# Patient Record
Sex: Male | Born: 1980 | Race: White | Hispanic: No | Marital: Married | Smoking: Never smoker
Health system: Southern US, Community
[De-identification: ages and names within clinical notes are randomized; demographics above are authoritative.]

## PROBLEM LIST (undated history)

## (undated) HISTORY — PX: OTHER SURGICAL HISTORY: SHX169

---

## 2021-03-25 ENCOUNTER — Emergency Department (HOSPITAL_BASED_OUTPATIENT_CLINIC_OR_DEPARTMENT_OTHER): Payer: Managed Care, Other (non HMO)

## 2021-03-25 ENCOUNTER — Encounter (HOSPITAL_BASED_OUTPATIENT_CLINIC_OR_DEPARTMENT_OTHER): Payer: Self-pay

## 2021-03-25 ENCOUNTER — Emergency Department (HOSPITAL_BASED_OUTPATIENT_CLINIC_OR_DEPARTMENT_OTHER)
Admission: EM | Admit: 2021-03-25 | Discharge: 2021-03-26 | Disposition: A | Discharge status: Other Acute Inpatient Hospital | Payer: Managed Care, Other (non HMO) | Attending: Emergency Medicine | Admitting: Emergency Medicine

## 2021-03-25 ENCOUNTER — Other Ambulatory Visit: Payer: Self-pay

## 2021-03-25 DIAGNOSIS — R2990 Unspecified symptoms and signs involving the nervous system: Secondary | ICD-10-CM

## 2021-03-25 DIAGNOSIS — Z20822 Contact with and (suspected) exposure to covid-19: Secondary | ICD-10-CM | POA: Insufficient documentation

## 2021-03-25 DIAGNOSIS — I639 Cerebral infarction, unspecified: Secondary | ICD-10-CM | POA: Diagnosis not present

## 2021-03-25 DIAGNOSIS — R0989 Other specified symptoms and signs involving the circulatory and respiratory systems: Secondary | ICD-10-CM | POA: Diagnosis present

## 2021-03-25 DIAGNOSIS — R29810 Facial weakness: Secondary | ICD-10-CM | POA: Diagnosis present

## 2021-03-25 LAB — RAPID URINE DRUG SCREEN, HOSP PERFORMED
Amphetamines: NOT DETECTED
Barbiturates: NOT DETECTED
Benzodiazepines: NOT DETECTED
Cocaine: NOT DETECTED
Opiates: NOT DETECTED
Tetrahydrocannabinol: NOT DETECTED

## 2021-03-25 LAB — DIFFERENTIAL
Abs Immature Granulocytes: 0.03 10*3/uL (ref 0.00–0.07)
Basophils Absolute: 0.1 10*3/uL (ref 0.0–0.1)
Basophils Relative: 1 %
Eosinophils Absolute: 0.1 10*3/uL (ref 0.0–0.5)
Eosinophils Relative: 2 %
Immature Granulocytes: 0 %
Lymphocytes Relative: 47 %
Lymphs Abs: 3.7 10*3/uL (ref 0.7–4.0)
Monocytes Absolute: 0.5 10*3/uL (ref 0.1–1.0)
Monocytes Relative: 6 %
Neutro Abs: 3.4 10*3/uL (ref 1.7–7.7)
Neutrophils Relative %: 44 %

## 2021-03-25 LAB — CBC
HCT: 43.5 % (ref 39.0–52.0)
Hemoglobin: 14.6 g/dL (ref 13.0–17.0)
MCH: 29.9 pg (ref 26.0–34.0)
MCHC: 33.6 g/dL (ref 30.0–36.0)
MCV: 89 fL (ref 80.0–100.0)
Platelets: 310 10*3/uL (ref 150–400)
RBC: 4.89 MIL/uL (ref 4.22–5.81)
RDW: 12.4 % (ref 11.5–15.5)
WBC: 7.8 10*3/uL (ref 4.0–10.5)
nRBC: 0 % (ref 0.0–0.2)

## 2021-03-25 LAB — COMPREHENSIVE METABOLIC PANEL
ALT: 46 U/L — ABNORMAL HIGH (ref 0–44)
AST: 34 U/L (ref 15–41)
Albumin: 4.7 g/dL (ref 3.5–5.0)
Alkaline Phosphatase: 61 U/L (ref 38–126)
Anion gap: 12 (ref 5–15)
BUN: 16 mg/dL (ref 6–20)
CO2: 24 mmol/L (ref 22–32)
Calcium: 9.5 mg/dL (ref 8.9–10.3)
Chloride: 100 mmol/L (ref 98–111)
Creatinine, Ser: 0.82 mg/dL (ref 0.61–1.24)
GFR, Estimated: >60 mL/min (ref >60)
Glucose, Bld: 126 mg/dL — ABNORMAL HIGH (ref 70–99)
Potassium: 3.4 mmol/L — ABNORMAL LOW (ref 3.5–5.1)
Sodium: 136 mmol/L (ref 135–145)
Total Bilirubin: 0.6 mg/dL (ref 0.3–1.2)
Total Protein: 7.9 g/dL (ref 6.5–8.1)

## 2021-03-25 LAB — RESP PANEL BY RT-PCR (FLU A&B, COVID) ARPGX2
Influenza A by PCR: NEGATIVE
Influenza B by PCR: NEGATIVE
SARS Coronavirus 2 by RT PCR: NEGATIVE

## 2021-03-25 LAB — URINALYSIS, ROUTINE W REFLEX MICROSCOPIC
Bilirubin Urine: NEGATIVE
Glucose, UA: NEGATIVE mg/dL
Hgb urine dipstick: NEGATIVE
Ketones, ur: NEGATIVE mg/dL
Leukocytes,Ua: NEGATIVE
Nitrite: NEGATIVE
Protein, ur: NEGATIVE mg/dL
Specific Gravity, Urine: 1.02 (ref 1.005–1.030)
pH: 5 (ref 5.0–8.0)

## 2021-03-25 LAB — APTT: aPTT: 26 seconds (ref 24–36)

## 2021-03-25 LAB — PROTIME-INR
INR: 0.9 (ref 0.8–1.2)
Prothrombin Time: 12 seconds (ref 11.4–15.2)

## 2021-03-25 LAB — CBG MONITORING, ED: Glucose-Capillary: 109 mg/dL — ABNORMAL HIGH (ref 70–99)

## 2021-03-25 MED ORDER — IOHEXOL 350 MG/ML SOLN
100.0000 mL | Freq: Once | INTRAVENOUS | Status: AC | PRN
  Administered 2021-03-25: 75 mL via INTRAVENOUS

## 2021-03-25 MED ORDER — IOHEXOL 350 MG/ML SOLN
100.0000 mL | Freq: Once | INTRAVENOUS | Status: AC | PRN
  Administered 2021-03-25: 100 mL via INTRAVENOUS

## 2021-03-25 MED ORDER — SODIUM CHLORIDE 0.9 % IV SOLN
50.0000 mL | Freq: Once | INTRAVENOUS | Status: AC
  Administered 2021-03-25: 50 mL via INTRAVENOUS

## 2021-03-25 MED ORDER — ALTEPLASE (STROKE) FULL DOSE INFUSION
0.9000 mg/kg | Freq: Once | INTRAVENOUS | Status: AC
  Administered 2021-03-25: 87.4 mg via INTRAVENOUS
  Filled 2021-03-25 (×2): qty 100

## 2021-03-25 MED ORDER — ACETAMINOPHEN 500 MG PO TABS
1000.0000 mg | ORAL_TABLET | Freq: Once | ORAL | Status: AC
  Administered 2021-03-25: 1000 mg via ORAL
  Filled 2021-03-25: qty 2

## 2021-03-25 NOTE — ED Notes (Signed)
ED Provider at bedside.  Tele neuro MD recommending TPA infusion.  Wife at bedside to discuss risk/benefits of tpa and answer questions

## 2021-03-25 NOTE — ED Notes (Signed)
Teleneuro exam in progress.

## 2021-03-25 NOTE — ED Notes (Signed)
Infusion complete, pt speaking more clearly, does report mild headache

## 2021-03-25 NOTE — ED Notes (Signed)
Pt reports recent tx for sinus infection

## 2021-03-25 NOTE — ED Notes (Signed)
Pt continues to have some difficulty with word finding, but less delay in speech.  Reports "feeling clearer".  No untoward bleeding observed.

## 2021-03-25 NOTE — ED Notes (Signed)
Patient transported to CT 

## 2021-03-25 NOTE — ED Notes (Signed)
Patient transported to CT with RN with monitor with TPA infusion in progress.

## 2021-03-25 NOTE — ED Provider Notes (Addendum)
Emergency Department Provider Note   I have reviewed the triage vital signs and the nursing notes.   HISTORY  Chief Complaint Weakness (Right side weakness)   HPI Drew Payne is a 40 y.o. male arrives to the emergency department by private vehicle after suddenly developing the left side with numbness, face droop, difficulty with speech.  Patient was last normal at 3:45 PM today and arrives by private vehicle driven by his wife.  Patient is brought back immediately and is having some difficulty finding the appropriate words to use and thus the history is limited.  Level 5 caveat applies with concern for acute stroke.  There are some mention from triage that he may have had some amnesia to the event as well.   Level 5 caveat: AMS- CVA symptoms   History reviewed. No pertinent past medical history.  Patient Active Problem List   Diagnosis Date Noted  . Symptoms of cerebrovascular accident (CVA) 03/25/2021   Allergies Penicillins and Amoxicillin  History reviewed. No pertinent family history.  Social History Social History   Tobacco Use  . Smoking status: Never Smoker  . Smokeless tobacco: Never Used  Substance Use Topics  . Alcohol use: Yes  . Drug use: Never    Review of Systems  Level 5 caveat: Expressive aphasia   ____________________________________________   PHYSICAL EXAM:  VITAL SIGNS: ED Triage Vitals  Enc Vitals Group     BP 03/25/21 1620 (!) 153/95     Pulse Rate 03/25/21 1620 97     Resp 03/25/21 1620 18     Temp 03/25/21 1620 97.8 F (36.6 C)     Temp Source 03/25/21 1620 Oral     SpO2 03/25/21 1620 100 %   Constitutional: Alert and oriented. Well appearing and in no acute distress. Eyes: Conjunctivae are normal. PERRL. EOMI. Head: Atraumatic. Nose: No congestion/rhinnorhea. Mouth/Throat: Mucous membranes are moist.  Neck: No stridor.   Cardiovascular: Normal rate, regular rhythm. Good peripheral circulation. Grossly normal heart sounds.    Respiratory: Normal respiratory effort.  No retractions. Lungs CTAB. Gastrointestinal: Soft and nontender. No distention.  Musculoskeletal: No lower extremity tenderness nor edema. No gross deformities of extremities. Neurologic: Patient is having significant difficulty with fluent speech.  He is able to say yes/no but when trying to explain his symptoms often becomes frustrated and says inappropriate words.  For example he is pointing to his right leg and saying that it began "[place]" instead of on the left. 5/5 strength and apparent normal sensation but speech deficit remains.  Skin:  Skin is warm, dry and intact. No rash noted.  ____________________________________________   LABS (all labs ordered are listed, but only abnormal results are displayed)  Labs Reviewed  COMPREHENSIVE METABOLIC PANEL - Abnormal; Notable for the following components:      Result Value   Potassium 3.4 (*)    Glucose, Bld 126 (*)    ALT 46 (*)    All other components within normal limits  CBG MONITORING, ED - Abnormal; Notable for the following components:   Glucose-Capillary 109 (*)    All other components within normal limits  RESP PANEL BY RT-PCR (FLU A&B, COVID) ARPGX2  ETHANOL  PROTIME-INR  APTT  CBC  DIFFERENTIAL  RAPID URINE DRUG SCREEN, HOSP PERFORMED  URINALYSIS, ROUTINE W REFLEX MICROSCOPIC   ____________________________________________  EKG   EKG Interpretation  Date/Time:  Sunday March 25 2021 16:39:03 EDT Ventricular Rate:  97 PR Interval:  142 QRS Duration: 118 QT Interval:  344 QTC Calculation: 437 R Axis:   60 Text Interpretation: Sinus rhythm Probable left atrial enlargement Incomplete right bundle branch block Confirmed by Alona Bene [phone]) on 03/25/2021 4:58:01 PM       ____________________________________________  RADIOLOGY  CT HEAD CODE STROKE WO CONTRAST  Result Date: 03/25/2021 CLINICAL DATA:  Code stroke. Neuro deficit, acute, stroke suspected. Additional  history provided: Patient reports episode of right-sided weakness and memory difficulty for 10 minutes. EXAM: CT HEAD WITHOUT CONTRAST TECHNIQUE: Contiguous axial images were obtained from the base of the skull through the vertex without intravenous contrast. COMPARISON:  No pertinent prior exams available for comparison. FINDINGS: Brain: Cerebral volume is normal. There is no acute intracranial hemorrhage. No demarcated cortical infarct. No extra-axial fluid collection. No evidence of intracranial mass. No midline shift. Vascular: No hyperdense vessel. Skull: Normal. Negative for fracture or focal lesion. Sinuses/Orbits: Visualized orbits show no acute finding. Small left frontal sinus mucous retention cyst. Background mild mucosal thickening within the left frontal sinus. Mild bilateral ethmoid sinus mucosal thickening. Small left sphenoid sinus mucous retention cyst. Moderate mucosal thickening and small volume frothy secretions within the right maxillary sinus. Partially imaged mucosal thickening within the left maxillary sinus. ASPECTS (Alberta Stroke Program Early CT Score) - Ganglionic level infarction (caudate, lentiform nuclei, internal capsule, insula, M1-M3 cortex): 7 - Supraganglionic infarction (M4-M6 cortex): 3 Total score (0-10 with 10 being normal): 10 These results were called by telephone at the time of interpretation on 03/25/2021 at 4:45 pm to provider Dajuana Palen , who verbally acknowledged these results. IMPRESSION: No evidence of acute intracranial abnormality. ASPECTS is 10. Paranasal sinus disease as described. Correlate for acute on chronic sinusitis. Electronically Signed   By: Jackey Loge DO   On: 03/25/2021 16:46   CT ANGIO HEAD CODE STROKE  Result Date: 03/25/2021 CLINICAL DATA:  Stroke/TIA, assess intracranial arteries. Confusion, difficulty with speech. EXAM: CT ANGIOGRAPHY HEAD AND NECK TECHNIQUE: Multidetector CT imaging of the head and neck was performed using the standard  protocol during bolus administration of intravenous contrast. Multiplanar CT image reconstructions and MIPs were obtained to evaluate the vascular anatomy. Carotid stenosis measurements (when applicable) are obtained utilizing NASCET criteria, using the distal internal carotid diameter as the denominator. CONTRAST:  OMNIPAQUE IOHEXOL 350 MG/ML SOLN COMPARISON:  Noncontrast head CT performed earlier today 03/25/2021. FINDINGS: CTA NECK FINDINGS Evaluation is slightly limited due to suboptimal contrast bolus timing. Aortic arch: Common origin of the innominate and left common carotid arteries. No hemodynamically significant innominate or proximal subclavian artery stenosis. Right carotid system: CCA and ICA patent within the neck without stenosis. No significant atherosclerotic disease. Tortuous cervical ICA. Left carotid system: CCA and ICA patent within the neck without stenosis. Mild tortuosity of the cervical ICA. Vertebral arteries: Vertebral arteries codominant and patent within the neck without stenosis Skeleton: No acute bony abnormality or aggressive osseous lesion. Cervical spondylosis greatest at C5-C6. Other neck: 10 mm nonspecific cystic appearing cutaneous/subcutaneous lesion within the posterior right lower neck (series 8, image 148). No cervical lymphadenopathy. Upper chest: No consolidation within the imaged lung apices. Review of the MIP images confirms the above findings CTA HEAD FINDINGS Evaluation is slightly limited due to suboptimal intracranial contrast bolus timing. Anterior circulation: The intracranial internal carotid arteries are patent. The M1 middle cerebral arteries are patent. No M2 proximal branch occlusion is identified. The anterior cerebral arteries are patent. No intracranial aneurysm is identified. Posterior circulation: The intracranial vertebral arteries are patent. The basilar artery is patent.  The posterior cerebral arteries are patent. Posterior communicating arteries  are hypoplastic or absent bilaterally. Venous sinuses: Within the limitations of contrast timing, no convincing thrombus. Anatomic variants: None significant Review of the MIP images confirms the above findings Significant limited examination. No large vessel occlusion is identified. These results were called by telephone at the time of interpretation on 03/25/2021 at 5:41 pm to provider Dr. Jacqulyn Bath, who verbally acknowledged these results. IMPRESSION: CTA neck: 1. Evaluation is slightly limited due to suboptimal contrast bolus timing. 2. The bilateral common carotid, internal carotid and vertebral arteries are patent within the neck without stenosis. 3. Nonspecific 10 mm cystic appearing cutaneous/subcutaneous lesion within the posterior right lower neck. Direct visualization is recommended. CTA head: 1. Evaluation is significantly limited due to suboptimal intracranial contrast bolus timing. 2. Within this limitation, no intracranial large vessel occlusion is identified. Electronically Signed   By: Jackey Loge DO   On: 03/25/2021 17:43   CT ANGIO NECK CODE STROKE  Result Date: 03/25/2021 CLINICAL DATA:  Stroke/TIA, assess intracranial arteries. Confusion, difficulty with speech. EXAM: CT ANGIOGRAPHY HEAD AND NECK TECHNIQUE: Multidetector CT imaging of the head and neck was performed using the standard protocol during bolus administration of intravenous contrast. Multiplanar CT image reconstructions and MIPs were obtained to evaluate the vascular anatomy. Carotid stenosis measurements (when applicable) are obtained utilizing NASCET criteria, using the distal internal carotid diameter as the denominator. CONTRAST:  OMNIPAQUE IOHEXOL 350 MG/ML SOLN COMPARISON:  Noncontrast head CT performed earlier today 03/25/2021. FINDINGS: CTA NECK FINDINGS Evaluation is slightly limited due to suboptimal contrast bolus timing. Aortic arch: Common origin of the innominate and left common carotid arteries. No hemodynamically  significant innominate or proximal subclavian artery stenosis. Right carotid system: CCA and ICA patent within the neck without stenosis. No significant atherosclerotic disease. Tortuous cervical ICA. Left carotid system: CCA and ICA patent within the neck without stenosis. Mild tortuosity of the cervical ICA. Vertebral arteries: Vertebral arteries codominant and patent within the neck without stenosis Skeleton: No acute bony abnormality or aggressive osseous lesion. Cervical spondylosis greatest at C5-C6. Other neck: 10 mm nonspecific cystic appearing cutaneous/subcutaneous lesion within the posterior right lower neck (series 8, image 148). No cervical lymphadenopathy. Upper chest: No consolidation within the imaged lung apices. Review of the MIP images confirms the above findings CTA HEAD FINDINGS Evaluation is slightly limited due to suboptimal intracranial contrast bolus timing. Anterior circulation: The intracranial internal carotid arteries are patent. The M1 middle cerebral arteries are patent. No M2 proximal branch occlusion is identified. The anterior cerebral arteries are patent. No intracranial aneurysm is identified. Posterior circulation: The intracranial vertebral arteries are patent. The basilar artery is patent. The posterior cerebral arteries are patent. Posterior communicating arteries are hypoplastic or absent bilaterally. Venous sinuses: Within the limitations of contrast timing, no convincing thrombus. Anatomic variants: None significant Review of the MIP images confirms the above findings Significant limited examination. No large vessel occlusion is identified. These results were called by telephone at the time of interpretation on 03/25/2021 at 5:41 pm to provider Dr. Jacqulyn Bath, who verbally acknowledged these results. IMPRESSION: CTA neck: 1. Evaluation is slightly limited due to suboptimal contrast bolus timing. 2. The bilateral common carotid, internal carotid and vertebral arteries are patent  within the neck without stenosis. 3. Nonspecific 10 mm cystic appearing cutaneous/subcutaneous lesion within the posterior right lower neck. Direct visualization is recommended. CTA head: 1. Evaluation is significantly limited due to suboptimal intracranial contrast bolus timing. 2. Within this limitation, no  intracranial large vessel occlusion is identified. Electronically Signed   By: Jackey Loge DO   On: 03/25/2021 17:43    ____________________________________________   PROCEDURES  Procedure(s) performed:   .Critical Care Performed by: Maia Plan, MD Authorized by: Maia Plan, MD   Critical care provider statement:    Critical care time (minutes):  45   Critical care time was exclusive of:  Separately billable procedures and treating other patients and teaching time   Critical care was necessary to treat or prevent imminent or life-threatening deterioration of the following conditions:  CNS failure or compromise   Critical care was time spent personally by me on the following activities:  Discussions with consultants, evaluation of patient's response to treatment, examination of patient, ordering and performing treatments and interventions, ordering and review of laboratory studies, ordering and review of radiographic studies, pulse oximetry, re-evaluation of patient's condition, obtaining history from patient or surrogate, review of old charts, blood draw for specimens and development of treatment plan with patient or surrogate   I assumed direction of critical care for this patient from another provider in my specialty: no     Care discussed with: admitting provider       ____________________________________________   INITIAL IMPRESSION / ASSESSMENT AND PLAN / ED COURSE  Pertinent labs & imaging results that were available during my care of the patient were reviewed by me and considered in my medical decision making (see chart for details).   Patient arrives by private  vehicle with acute onset neuro deficits.  I do not appreciate any unilateral weakness or numbness although he is having significant speech difficulty.  He is using inappropriate words at times but also having difficulty describing his symptoms fluently.  With ongoing speech deficit and report of unilateral symptoms in the car I have activated a code stroke.   04:45 PM  Spoke with Radiology. No acute findings on head CT.   Teleneurology recommending TPA and have ordered this.  Patient doing well after the infusion symptoms are improved overall.  The image quality and dye timing of the CT angio of the head is reduced after my discussion with neurology.  I spoke with Dr. Amada Jupiter with the neuro hospitalist at [facility].  He will accept him to the neuro ICU at [facility] but would like a CTA of the head to be repeated ASAP to evaluate for possible M2 occlusion.  Have reordered this and discussed with the CT team regarding need for repeat study.  Discussed patient's case with Neurology to request admission. Patient and family (if present) updated with plan. Care transferred to Neurology service.  I reviewed all nursing notes, vitals, pertinent old records, EKGs, labs, imaging (as available).  06:57 PM  Called to bedside by the nurse.  The patient's wife and husband would be most comfortable having their care at [facility]. The patient has a bed assigned at the [facility] ICU.  We discussed that I do not want to delay transfer but I am happy to call the [facility] and/or [facility] transfer center to try and get him sent to a hospital closer to them.   Spoke with Dr. Mosetta Putt and [facility]. He cannot accept this patient to [facility] due to lack of capacity and no reason why we cannot manage locally.   Dr. Regino Schultze at [facility] is able to accept. EMTALA completed. Updated patient and wife at bedside. Will arrange transfer but again this is more delayed than if patient would be admitted to [facility]  where a bed and transport is ready. [facility] understand and  [facility]   ____________________________________________  FINAL CLINICAL IMPRESSION(S) / ED DIAGNOSES  Final diagnoses:  Stroke-like symptoms    MEDICATIONS GIVEN DURING THIS VISIT:  Medications  alteplase (ACTIVASE) 1 mg/mL infusion 87.4 mg (0 mg/kg  97.1 kg Intravenous Stopped 03/25/21 1806)    Followed by  0.9 %  sodium chloride infusion (50 mLs Intravenous New Bag/Given 03/25/21 1827)  iohexol (OMNIPAQUE) 350 MG/ML injection 100 mL (100 mLs Intravenous Contrast Given 03/25/21 1713)  iohexol (OMNIPAQUE) 350 MG/ML injection 100 mL (75 mLs Intravenous Contrast Given 03/25/21 1846)    Note:  This document was prepared using Dragon voice recognition software and may include unintentional dictation errors.  Alona BeneJoshua Hanish Laraia, MD, [facility] Emergency [facility]    Devanee Pomplun, Arlyss RepressJoshua G, MD 03/25/21 Florentina [id number]    Bunnie Lederman G, MD 03/26/21 0001

## 2021-03-25 NOTE — ED Notes (Signed)
Report given to [facility] transfer center.  To call back with an ETA.

## 2021-03-25 NOTE — ED Notes (Signed)
To CT on monitor with RN

## 2021-03-25 NOTE — ED Notes (Signed)
Last known well 1545

## 2021-03-25 NOTE — ED Notes (Signed)
ED Provider at bedside. Discussing request for transfer to [facility] for admission.  Symptoms improving, wife at bedside.  Reports mild headache, Dr Jacqulyn Bath made aware.

## 2021-03-25 NOTE — Consult Note (Signed)
[facility] Tele[facility]rology Consult Services   Date of Service:   03/25/2021 16:31:50  Diagnosis:     .  I63.9 - Cerebrovascular accident (CVA), unspecified mechanism (HCC)  Impression:     . 40 yo male with no significant past medical history who presented to the ED with c/o persistent word finding with transient right sided weakness. Current exam shows no axial weakness or ataxia however persistent word finding with mild dysfluency noted. NIH 2. Due to TLKW and disabling speech symptoms patient is a thrombolytic candidate. Risk and benefits of Alteplase reviewed with patient and wife. No exclusions to treatment found, patient agreeable to plan of care. IV Alteplase given at 1705. CTA shows no LVO.              Diff DX;       Small vessel ischemic stroke       TIA              Recs:       Admit to ICU       Post tPA vital signs and neuro checks       Keep BP < 180/105       Keep Glucose < 180       Angioedema, bleeding, seizure, aspiration, fall precautions       Dysphagia screen now, if aspiration risk, NPO until cleared by speech       Hold antithrombotics until repeat CT negative       Hold any anticoagulation until cleared by Neurology       Repeat CT brain - 24 hours post IV tPA       MRI Brain       TTE with bubble study       UDS       HgAIC       Lipid panel - target LDL < 70       TSH       PT/OT/ST         Metrics: Last Known Well: 03/25/2021 15:45:37 [facility] Notification Time: 03/25/2021 16:31:11 Arrival Time: 03/25/2021 16:04:05 Stamp Time: 03/25/2021 16:31:50 Initial Response Time: 03/25/2021 16:33:37 Symptoms: persistent word finding. Marland Kitchen NIHSS Start Assessment Time: 03/25/2021 16:35:44 Patient is a candidate for Thrombolytic. Thrombolytic Medical Decision: 03/25/2021 16:37:39 Needle Time: 03/25/2021 17:05:31 Weight Noted by Staff: 97.1 kg  CT head showed no acute hemorrhage or acute core infarct.  Radiologist was called back  for review of advanced imaging on 03/25/2021 17:55:51 ED Physician notified of diagnostic impression and management plan on 03/25/2021 17:56:51  Advanced Imaging: CTA Head and Neck Completed.  LVO:No  Patient doesn't meet criteria for emergent NIR consideration   Thrombolytic Contraindications:  Last Known Well > 4.5 hours: No CT Head showing hemorrhage: No Ischemic stroke within 3 months: No Severe head trauma within 3 months: No Intracranial/intraspinal surgery within 3 months: No History of intracranial hemorrhage: No Symptoms and signs consistent with an SAH: No GI malignancy or GI bleed within 21 days: No Coagulopathy: Platelets <100 000 /mm3, INR >1.7, aPTT>40 s, or PT >15 s: No Treatment dose of LMWH within the previous 24 hrs: No Use of NOACs in past 48 hours: No Glycoprotein IIb/IIIa receptor inhibitors use: No Symptoms consistent with infective endocarditis: No Suspected aortic arch dissection: No Intra-axial intracranial neoplasm: No  Thrombolytic Decision and Management Plan: Management with thrombolytic treatment was explained to the Patient and Family as was risks and benefits and alternatives to the treatment. Patient agrees with the decision  to proceed with thrombolytic treatment. . All questions were answered and the Patient and Family expressed understanding of the treatment plan.  Our recommendations are outlined below.  Recommendations: IV Alteplase recommended.  Thrombolytic bolus given Without Complication.   IV Alteplase/Activase Total Dose - 87.4 mg IV Alteplase/Activase Bolus Dose - 8.7 mg IV Alteplase/Activase Infusion Dose - 78.7 mg   Routine post Thrombolytic monitoring including neuro checks and blood pressure control during/after treatment Monitor blood pressure Check blood pressure and neuro assessment every 15 min for 2 h, then every 30 min for 6 h, and finally every hour for 16 h.  Manage Blood Pressure per post Thrombolytic  protocol.      .  Admission to ICU     .  CT brain 24 hours post Thrombolytic     .  NPO until swallowing screen performed and passed     .  No antiplatelet agents or anticoagulants (including heparin for DVT prophylaxis) in first 24 hours     .  No Foley catheter, nasogastric tube, arterial catheter or central venous catheter for 24 hr, unless absolutely necessary     .  Telemetry     .  Bedside swallow evaluation     .  HOB less than 30 degrees     .  Euglycemia     .  Avoid hyperthermia, PRN acetaminophen     .  DVT prophylaxis     .  Inpatient Neurology Consultation     .  Stroke evaluation as per inpatient neurology recommendations  Discussed with ED physician    ------------------------------------------------------------------------------  History of Present Illness: Patient is a 40 year old Male.  Patient was brought by private transportation with symptoms of persistent word finding. .  40 yo male with no significant past medical history presents to the ED with c/o word finding and right sided weakness. Wife reports they were driving in the car when patient suddenly developed right sided facial droop and weakness with difficulty speaking. She reports weakness lasted approx 10 mins however word finding still present. She describes patient could not identify his right arm and when asked he would point to leg and was disoriented. On exam patient with dysfluency in conversation. Risk and benefits of alteplase reviewed with patient and wife. all inclusion and exclusion criteria reviewed with patient. Agree to plan of care.   Past Medical History:     . There is NO history of Hypertension     . There is NO history of Diabetes Mellitus     . There is NO history of Hyperlipidemia     . There is NO history of Atrial Fibrillation     . There is NO history of Coronary Artery Disease     . There is NO history of Stroke     . There is NO history of Covid-19     . asthma,  vasectomy  Social History: Smoking: No Alcohol Use: Yes Drug Use: No  Family History:Unable to obtain due to Patient Status  Review of System:  14 Points Review of Systems was performed and was negative except mentioned in HPI.  Anticoagulant use:  No  Antiplatelet use: No  Allergies:  Reviewed   Examination: BP(145/94), Pulse(105), Blood Glucose(109) 1A: Level of Consciousness - Alert; keenly responsive + 0 1B: Ask Month and Age - 1 Question Right + 1 1C: Blink Eyes & Squeeze Hands - Performs Both Tasks + 0 2: Test Horizontal Extraocular Movements - Normal +  0 3: Test Visual Fields - No Visual Loss + 0 4: Test Facial Palsy (Use Grimace if Obtunded) - Normal symmetry + 0 5A: Test Left Arm Motor Drift - No Drift for 10 Seconds + 0 5B: Test Right Arm Motor Drift - No Drift for 10 Seconds + 0 6A: Test Left Leg Motor Drift - No Drift for 5 Seconds + 0 6B: Test Right Leg Motor Drift - No Drift for 5 Seconds + 0 7: Test Limb Ataxia (FNF/Heel-Shin) - No Ataxia + 0 8: Test Sensation - Normal; No sensory loss + 0 9: Test Language/Aphasia - Mild-Moderate Aphasia: Some Obvious Changes, Without Significant Limitation + 1 10: Test Dysarthria - Normal + 0 11: Test Extinction/Inattention - No abnormality + 0  NIHSS Score: 2  Pre-Morbid Modified Rankin Scale: 0 Points = No symptoms at all   Patient/Family was informed the Neurology Consult would occur via TeleHealth consult by way of interactive audio and video telecommunications and consented to receiving care in this manner.   Patient is being evaluated for possible acute neurologic impairment and high probability of imminent or life-threatening deterioration. I spent total of 45 minutes providing care to this patient, including time for face to face visit via telemedicine, review of medical records, imaging studies and discussion of findings with providers, the patient and/or family.   Dr Carson Myrtle Amritha Yorke   [facility] [phone]  Case [id number]

## 2021-03-25 NOTE — ED Triage Notes (Addendum)
Pt reports episode of right sided weakness and memory difficulty for 10 min at 1550 resolved upon arrival to ED. Pt now awake, alert, answering questions appropriately, grips strong and equal bilat ambulating with steady gait.  Dr Jacqulyn Bath made aware and at bedside. Pt with delay in word finding and some inappropriate words.  Code stroke initiated.

## 2021-03-26 LAB — ETHANOL: Alcohol, Ethyl (B): <10 mg/dL (ref <10)

## 2021-06-21 ENCOUNTER — Other Ambulatory Visit: Payer: Self-pay

## 2021-06-21 NOTE — Patient Outreach (Signed)
[facility]) Care Management  06/21/2021  Drew Payne [birth year] [id number]   First telephone outreach attempt to obtain mRS. No answer. Left message for returned call.  Vanice Sarah [facility] Management Assistant [phone]

## 2021-07-03 ENCOUNTER — Other Ambulatory Visit: Payer: Self-pay

## 2021-07-03 NOTE — Patient Outreach (Signed)
[facility]) Care Management  07/03/2021  Colon Rueth [birth year] [id number]   Second telephone outreach attempt to obtain mRS. No answer. Left message for returned call.  Vanice Sarah [facility] Management Assistant [phone]

## 2021-07-05 ENCOUNTER — Other Ambulatory Visit: Payer: Self-pay

## 2021-07-05 NOTE — Patient Outreach (Signed)
[facility]) Care Management  07/05/2021  Patrice Moates Jul 10, 1981 [id number]   Telephone outreach to patient's spouse to obtain mRS was successfully completed. MRS= 0. Also informed that Patient said he didnt have a stroke and was misdiagnosed.    Vanice Sarah [facility] Care Management Assistant

## 2022-03-30 IMAGING — CT CT HEAD CODE STROKE
3 series · 14 of 47 positions shown, 16 images · non-contrast
Comparison: No pertinent prior exams available for comparison.

CLINICAL DATA: Code stroke. Neuro deficit, acute, stroke suspected.
Additional history provided: Patient reports episode of right-sided
weakness and memory difficulty for 10 minutes.

EXAM:
CT HEAD WITHOUT CONTRAST
TECHNIQUE: Contiguous axial images were obtained from the base of the skull
through the vertex without intravenous contrast.

[Series 3: head wo · axial · 0.47mm/px · z∈[+728,+868]mm · 8 of 34 slices shown, 10 images]
[im 3/34  brain]
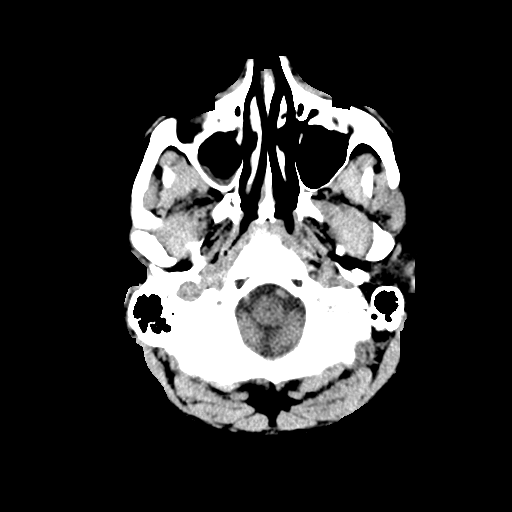
[im 3/34  bone]
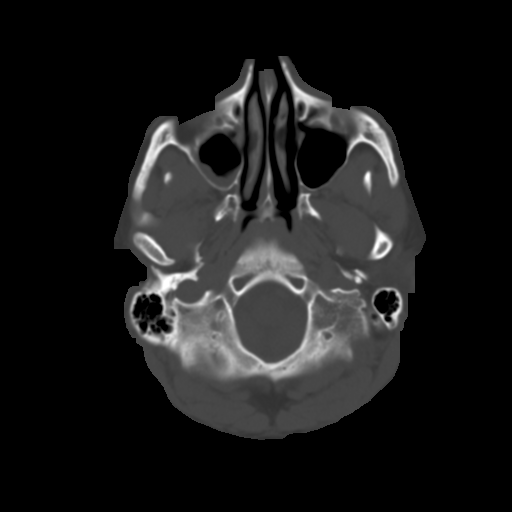
[im 7/34  brain]
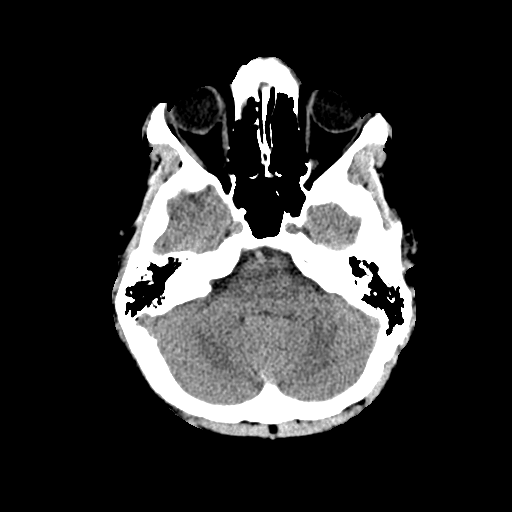
[im 11/34  brain]
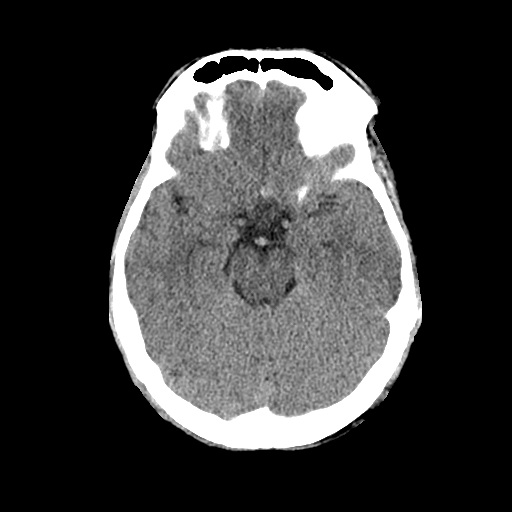
[im 15/34  brain]
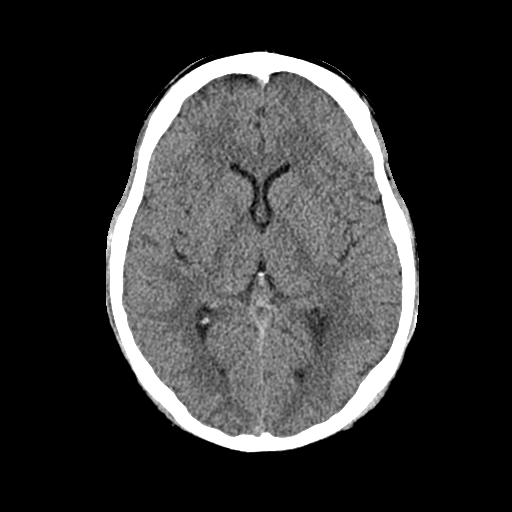
[im 19/34  brain]
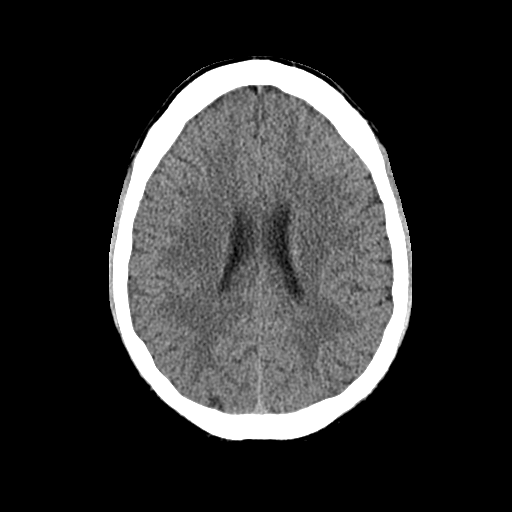
[im 19/34  bone]
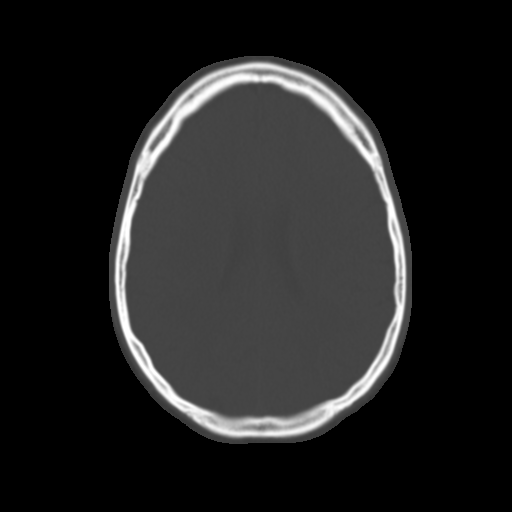
[im 23/34  brain]
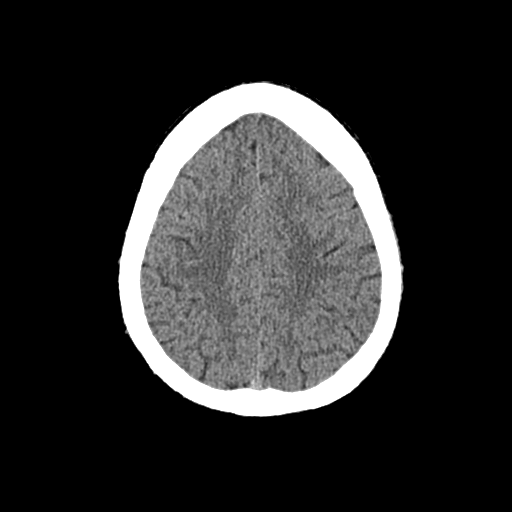
[im 27/34  brain]
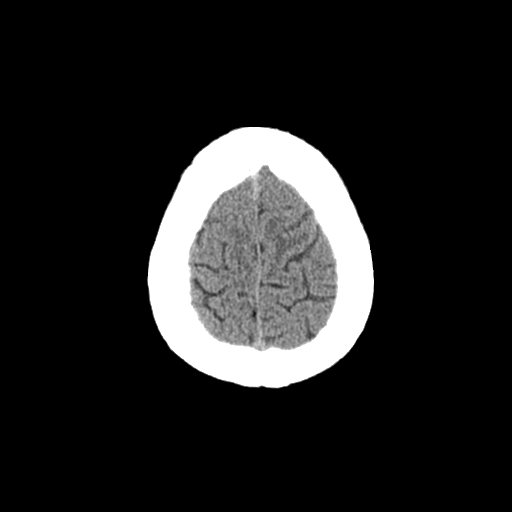
[im 31/34  brain]
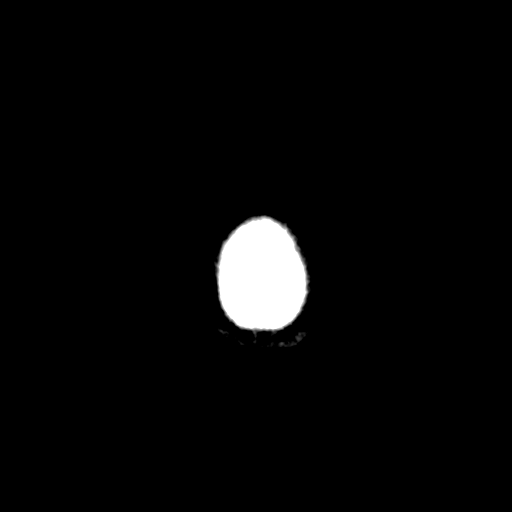

[Series 5: cor soft · coronal · 0.36mm/px · 3 of 70 slices shown]
[im 24/70  brain]
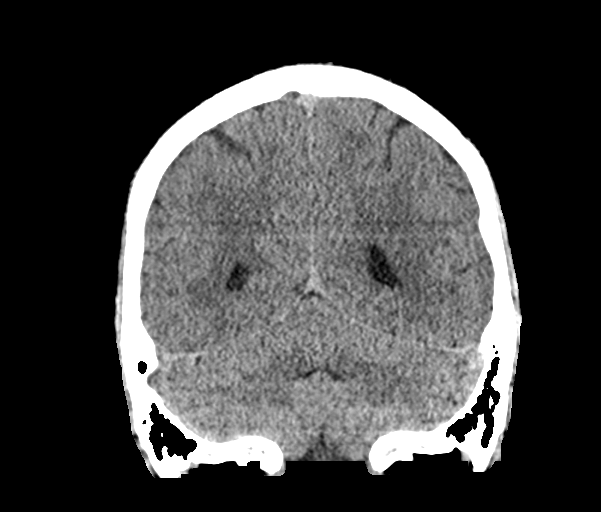
[im 31/70  brain]
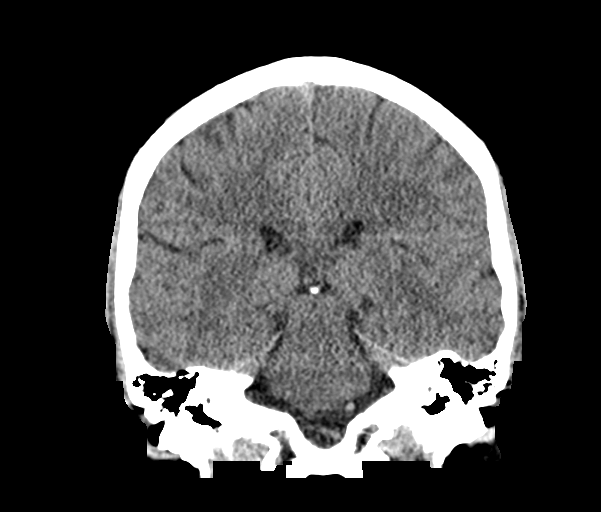
[im 39/70  brain]
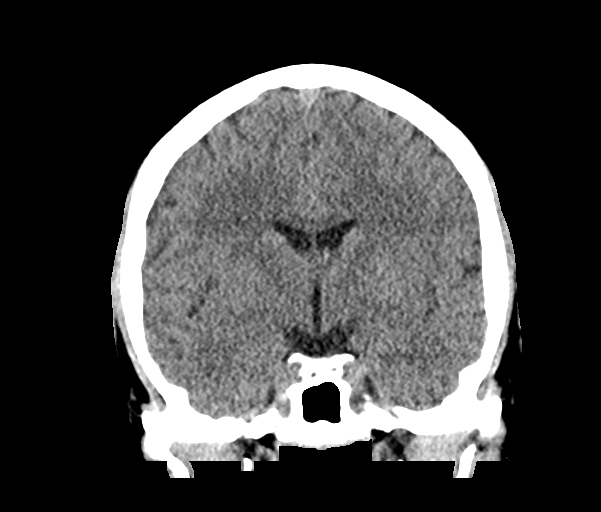

[Series 6: sag soft · sagittal · 0.36mm/px · 3 of 63 slices shown]
[im 21/63  brain]
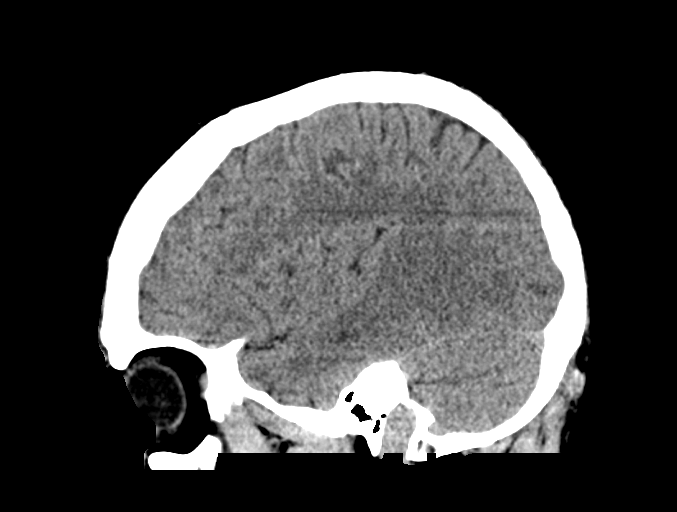
[im 32/63  brain]
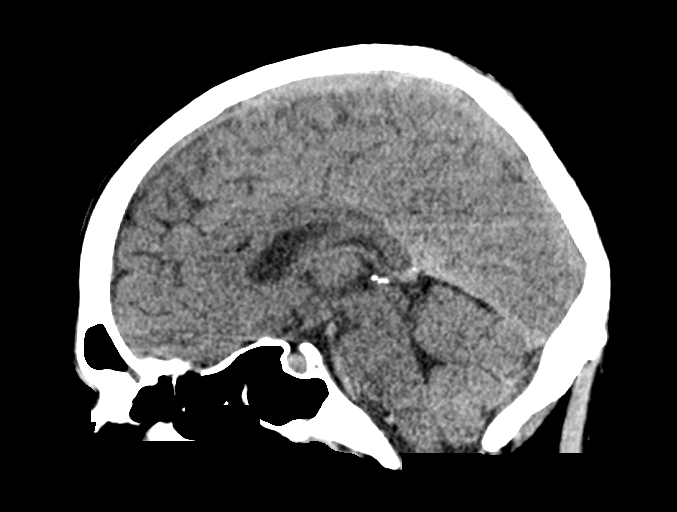
[im 42/63  brain]
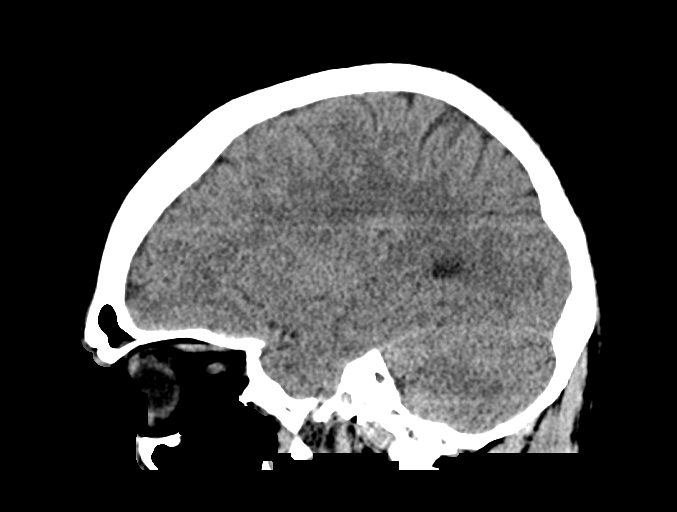

[14 of 47 positions shown; findings below may reference images not displayed]

FINDINGS: Brain:

Cerebral volume is normal.

There is no acute intracranial hemorrhage.

No demarcated cortical infarct.

No extra-axial fluid collection.

No evidence of intracranial mass.

No midline shift.

Vascular: No hyperdense vessel.

Skull: Normal. Negative for fracture or focal lesion.

Sinuses/Orbits: Visualized orbits show no acute finding. Small left
frontal sinus mucous retention cyst. Background mild mucosal
thickening within the left frontal sinus. Mild bilateral ethmoid
sinus mucosal thickening. Small left sphenoid sinus mucous retention
cyst. Moderate mucosal thickening and small volume frothy secretions
within the right maxillary sinus. Partially imaged mucosal
thickening within the left maxillary sinus.

ASPECTS (Alberta Stroke Program Early CT Score)

- Ganglionic level infarction (caudate, lentiform nuclei, internal
capsule, insula, M1-M3 cortex): 7

- Supraganglionic infarction (M4-M6 cortex): 3

Total score (0-10 with 10 being normal): 10

These results were called by telephone at the time of interpretation
on 03/25/2021 at [DATE] to provider VIRGE JODAR , who verbally
acknowledged these results.
IMPRESSION: No evidence of acute intracranial abnormality. ASPECTS is 10.

Paranasal sinus disease as described. Correlate for acute on chronic
sinusitis.
